# Patient Record
Sex: Female | Born: 1990 | Race: Black or African American | Hispanic: No | Marital: Single | Smoking: Current every day smoker
Health system: Southern US, Community
[De-identification: ages and names within clinical notes are randomized; demographics above are authoritative.]

## PROBLEM LIST (undated history)

## (undated) DIAGNOSIS — K2970 Gastritis, unspecified, without bleeding: Secondary | ICD-10-CM

## (undated) DIAGNOSIS — J45909 Unspecified asthma, uncomplicated: Secondary | ICD-10-CM

---

## 2018-08-06 ENCOUNTER — Encounter (HOSPITAL_BASED_OUTPATIENT_CLINIC_OR_DEPARTMENT_OTHER): Payer: Self-pay | Admitting: Emergency Medicine

## 2018-08-06 ENCOUNTER — Other Ambulatory Visit: Payer: Self-pay

## 2018-08-06 ENCOUNTER — Emergency Department (HOSPITAL_BASED_OUTPATIENT_CLINIC_OR_DEPARTMENT_OTHER)
Admission: EM | Admit: 2018-08-06 | Discharge: 2018-08-06 | Disposition: A | Discharge status: Home / Self Care | Payer: Self-pay | Attending: Emergency Medicine | Admitting: Emergency Medicine

## 2018-08-06 DIAGNOSIS — O9989 Other specified diseases and conditions complicating pregnancy, childbirth and the puerperium: Secondary | ICD-10-CM | POA: Insufficient documentation

## 2018-08-06 DIAGNOSIS — R1084 Generalized abdominal pain: Secondary | ICD-10-CM | POA: Insufficient documentation

## 2018-08-06 LAB — URINALYSIS, ROUTINE W REFLEX MICROSCOPIC
Bilirubin Urine: NEGATIVE
Glucose, UA: NEGATIVE mg/dL
Ketones, ur: NEGATIVE mg/dL
Nitrite: NEGATIVE
Protein, ur: NEGATIVE mg/dL
Specific Gravity, Urine: 1.02 (ref 1.005–1.030)
pH: 6 (ref 5.0–8.0)

## 2018-08-06 LAB — URINALYSIS, MICROSCOPIC (REFLEX)

## 2018-08-06 LAB — PREGNANCY, URINE: Preg Test, Ur: NEGATIVE

## 2018-08-06 NOTE — Discharge Instructions (Signed)
Your pregnancy test and ultrasound did not show an ongoing pregnancy at this time. I am very sorry. I have added the names and phone numbers of a local PCP and Gyn for follow up. You will need to call and schedule the next available appointment. Return to the ED with any new or suddenly worsening symptoms.

## 2018-08-06 NOTE — ED Notes (Signed)
Patient left at this time with all belongings. 

## 2018-08-06 NOTE — ED Triage Notes (Signed)
Pt c/o abd crampin 8/10 and lower back pain states she is 4 months pregnant, denies any vaginal discharge or bleeding no urinary symptoms.

## 2018-08-06 NOTE — ED Provider Notes (Signed)
Emergency Department Provider Note   I have reviewed the triage vital signs and the nursing notes.   HISTORY  Chief Complaint Abdominal Pain and Back Pain   HPI Felicia Jennings is a 28 y.o. female presents to the emergency department for evaluation of not feeling her pregnancy symptoms over the past several weeks.  She describes some left flank discomfort several days ago which has completely resolved.  4 weeks ago she had some vaginal spotting and abdominal cramping but no passage of clots or tissue.  She is recently moved to [place] and had an initial OB appointment early on in her pregnancy.  She estimates by that dating that she is approximately 4 months pregnant.  No fevers or chills.  No UTI symptoms.  No vaginal bleeding or discharge.   History reviewed. No pertinent past medical history.  There are no active problems to display for this patient.   History reviewed. No pertinent surgical history.  Allergies Patient has no known allergies.  History reviewed. No pertinent family history.  Social History Social History   Tobacco Use  . Smoking status: Never Smoker  . Smokeless tobacco: Never Used  Substance Use Topics  . Alcohol use: Never    Frequency: Never  . Drug use: Never    Review of Systems  Constitutional: No fever/chills Eyes: No visual changes. ENT: No sore throat. Cardiovascular: Denies chest pain. Respiratory: Denies shortness of breath. Gastrointestinal: Positive cramping abdominal pain (resolved).  No nausea, no vomiting.  No diarrhea.  No constipation. Genitourinary: Negative for dysuria. Musculoskeletal: Negative for back pain. Skin: Negative for rash. Neurological: Negative for headaches, focal weakness or numbness.  10-point ROS otherwise negative.  ____________________________________________   PHYSICAL EXAM:  VITAL SIGNS: ED Triage Vitals  Enc Vitals Group     BP 08/06/18 1959 (!) 138/99     Pulse Rate 08/06/18  1959 96     Resp 08/06/18 1959 18     Temp 08/06/18 1959 98.3 F (36.8 C)     Temp Source 08/06/18 1959 Oral     SpO2 08/06/18 1959 100 %     Weight 08/06/18 2000 244 lb (110.7 kg)     Height 08/06/18 2000 5\' 5"  (1.651 m)   Constitutional: Alert and oriented. Well appearing and in no acute distress. Eyes: Conjunctivae are normal.  Head: Atraumatic. Nose: No congestion/rhinnorhea. Mouth/Throat: Mucous membranes are moist. Neck: No stridor.  Cardiovascular: Normal rate, regular rhythm. Good peripheral circulation. Grossly normal heart sounds.   Respiratory: Normal respiratory effort.  No retractions. Lungs CTAB. Gastrointestinal: Soft and nontender. No distention.  Musculoskeletal: No lower extremity tenderness nor edema.  Neurologic:  Normal speech and language.  Skin:  Skin is warm, dry and intact. No rash noted.  ____________________________________________   LABS (all labs ordered are listed, but only abnormal results are displayed)  Labs Reviewed  URINALYSIS, ROUTINE W REFLEX MICROSCOPIC - Abnormal; Notable for the following components:      Result Value   Hgb urine dipstick LARGE (*)    Leukocytes,Ua TRACE (*)    All other components within normal limits  URINALYSIS, MICROSCOPIC (REFLEX) - Abnormal; Notable for the following components:   Bacteria, UA RARE (*)    All other components within normal limits  PREGNANCY, URINE   ____________________________________________   PROCEDURES  Procedure(s) performed:   Procedures  None  ____________________________________________   INITIAL IMPRESSION / ASSESSMENT AND PLAN / ED COURSE  Pertinent labs & imaging results that were available during my  care of the patient were reviewed by me and considered in my medical decision making (see chart for details).  Patient presents with not feeling her pregnancy.  She had an initial, early OB appointment several months ago but has not followed up since.  Her abdominal and flank  pain has resolved and was several weeks ago with some cramping in the past few days but none currently.  She is well-appearing on exam, afebrile.  I performed a quick bedside ultrasound which showed no visible pregnancy.  No abdominal fluid. Pregnancy test resulting negative.   08:30 PM  UA negative for infection.  Without active pain symptoms for the past days to weeks and negative pregnancy test do not feel that additional imaging is warranted.  I discussed my findings with the patient and advised that she follow-up with primary care physician.  Have also given information regarding local GYN.  Discussed ED return precautions in detail. ____________________________________________  FINAL CLINICAL IMPRESSION(S) / ED DIAGNOSES  Final diagnoses:  Generalized abdominal pain    Note:  This document was prepared using Dragon voice recognition software and may include unintentional dictation errors.  Alona Bene, MD Emergency Medicine     Lindsay Straka, Arlyss Repress, MD 08/06/18 2037

## 2018-08-06 NOTE — ED Notes (Signed)
ED Provider at bedside. 

## 2018-12-11 ENCOUNTER — Other Ambulatory Visit: Payer: Self-pay

## 2018-12-11 ENCOUNTER — Emergency Department (HOSPITAL_COMMUNITY): Payer: Self-pay

## 2018-12-11 ENCOUNTER — Encounter (HOSPITAL_COMMUNITY): Payer: Self-pay | Admitting: Obstetrics and Gynecology

## 2018-12-11 ENCOUNTER — Emergency Department (HOSPITAL_COMMUNITY)
Admission: EM | Admit: 2018-12-11 | Discharge: 2018-12-11 | Disposition: A | Discharge status: Home / Self Care | Payer: Self-pay | Attending: Emergency Medicine | Admitting: Emergency Medicine

## 2018-12-11 DIAGNOSIS — Y999 Unspecified external cause status: Secondary | ICD-10-CM | POA: Insufficient documentation

## 2018-12-11 DIAGNOSIS — Y939 Activity, unspecified: Secondary | ICD-10-CM | POA: Insufficient documentation

## 2018-12-11 DIAGNOSIS — W208XXA Other cause of strike by thrown, projected or falling object, initial encounter: Secondary | ICD-10-CM | POA: Insufficient documentation

## 2018-12-11 DIAGNOSIS — Y929 Unspecified place or not applicable: Secondary | ICD-10-CM | POA: Insufficient documentation

## 2018-12-11 DIAGNOSIS — S93401A Sprain of unspecified ligament of right ankle, initial encounter: Secondary | ICD-10-CM | POA: Insufficient documentation

## 2018-12-11 DIAGNOSIS — F17200 Nicotine dependence, unspecified, uncomplicated: Secondary | ICD-10-CM | POA: Insufficient documentation

## 2018-12-11 DIAGNOSIS — S8991XA Unspecified injury of right lower leg, initial encounter: Secondary | ICD-10-CM | POA: Insufficient documentation

## 2018-12-11 MED ORDER — IBUPROFEN 200 MG PO TABS
600.0000 mg | ORAL_TABLET | Freq: Once | ORAL | Status: AC
  Administered 2018-12-11: 600 mg via ORAL
  Filled 2018-12-11: qty 3

## 2018-12-11 NOTE — ED Provider Notes (Signed)
[facility] DEPT Provider Note   CSN: [id number] Arrival date & time: 12/11/18  1406    History   Chief Complaint Chief Complaint  Patient presents with  . Knee Pain  . Ankle Pain    HPI Felicia Jennings is a 28 y.o. female.     HPI  28 year old female presents with right ankle and right knee pain after an injury.  A mirror fell on her knee and then onto her ankle.  She is able to ambulate but has to limp.  The pain is severe.  This occurred just prior to arrival.  She took Tylenol for pain.  No weakness or numbness.  No past medical history on file.  There are no active problems to display for this patient.   No past surgical history on file.   OB History    Gravida  1   Para      Term      Preterm      AB      Living        SAB      TAB      Ectopic      Multiple      Live Births               Home Medications    Prior to Admission medications   Not on File    Family History No family history on file.  Social History Social History   Tobacco Use  . Smoking status: Current Every Day Smoker  . Smokeless tobacco: Never Used  Substance Use Topics  . Alcohol use: Yes    Alcohol/week: 7.0 standard drinks    Types: 7 Standard drinks or equivalent per week    Frequency: Never  . Drug use: Yes    Types: Marijuana     Allergies   Patient has no known allergies.   Review of Systems Review of Systems  Musculoskeletal: Positive for arthralgias and joint swelling.  Skin: Negative for wound.  Neurological: Negative for weakness and numbness.     Physical Exam Updated Vital Signs BP (!) 134/91 (BP Location: Left Arm)   Pulse (!) 103   Temp 98.8 F (37.1 C)   Resp 17   LMP 12/01/2018 (Approximate)   SpO2 99%   Breastfeeding Unknown   Physical Exam Vitals signs and nursing note reviewed.  Constitutional:      Appearance: She is well-developed.  HENT:     Head: Normocephalic and  atraumatic.     Right Ear: External ear normal.     Left Ear: External ear normal.     Nose: Nose normal.  Eyes:     General:        Right eye: No discharge.        Left eye: No discharge.  Cardiovascular:     Rate and Rhythm: Normal rate and regular rhythm.     Pulses:          Dorsalis pedis pulses are 2+ on the right side.     Heart sounds: Normal heart sounds.  Pulmonary:     Effort: Pulmonary effort is normal.     Breath sounds: Normal breath sounds.  Abdominal:     Palpations: Abdomen is soft.     Tenderness: There is no abdominal tenderness.  Musculoskeletal:     Right knee: She exhibits normal range of motion, no swelling and no effusion. Tenderness found. Lateral joint line tenderness noted.  Right ankle: She exhibits swelling. She exhibits normal range of motion. Tenderness. Lateral malleolus tenderness found.     Right lower leg: She exhibits tenderness (proximal and lateral). She exhibits no swelling.  Skin:    General: Skin is warm and dry.  Neurological:     Mental Status: She is alert.  Psychiatric:        Mood and Affect: Mood is not anxious.      ED Treatments / Results  Labs (all labs ordered are listed, but only abnormal results are displayed) Labs Reviewed - No data to display  EKG None  Radiology Dg Tibia/fibula Right  Result Date: 12/11/2018 CLINICAL DATA:  Right leg hit by a mirror. EXAM: RIGHT KNEE - COMPLETE 4+ VIEW; RIGHT TIBIA AND FIBULA - 2 VIEW COMPARISON:  None. FINDINGS: No evidence of fracture, dislocation, or joint effusion. No evidence of arthropathy or other focal bone abnormality. Soft tissues are unremarkable. No radiopaque foreign body identified. IMPRESSION: Negative. Electronically Signed   By: Obie DredgeWilliam T Derry M.D.   On: 12/11/2018 15:25   Dg Knee Complete 4 Views Right  Result Date: 12/11/2018 CLINICAL DATA:  Right leg hit by a mirror. EXAM: RIGHT KNEE - COMPLETE 4+ VIEW; RIGHT TIBIA AND FIBULA - 2 VIEW COMPARISON:  None.  FINDINGS: No evidence of fracture, dislocation, or joint effusion. No evidence of arthropathy or other focal bone abnormality. Soft tissues are unremarkable. No radiopaque foreign body identified. IMPRESSION: Negative. Electronically Signed   By: Obie DredgeWilliam T Derry M.D.   On: 12/11/2018 15:25    Procedures Procedures (including critical care time)  Medications Ordered in ED Medications  ibuprofen (ADVIL) tablet 600 mg (has no administration in time range)     Initial Impression / Assessment and Plan / ED Course  I have reviewed the triage vital signs and the nursing notes.  Pertinent labs & imaging results that were available during my care of the patient were reviewed by me and considered in my medical decision making (see chart for details).        Normal strength and sensation in her right foot.  Right ankle has some mild lateral swelling but full range of motion.  No tenderness over Achilles.  Right knee has some tenderness laterally but no swelling or effusion.  This appears to be superficial injuries.  Doubt occult fracture.  There were no dedicated ankle views when ordered in triage but there is no obvious lateral fracture on x-ray and my suspicion for fracture is pretty low.  Will Ace wrap and discussed using NSAIDs and Tylenol in alternating fashion.  Final Clinical Impressions(s) / ED Diagnoses   Final diagnoses:  Sprain of right ankle, unspecified ligament, initial encounter  Injury of right knee, initial encounter    ED Discharge Orders    None       Pricilla LovelessGoldston, Azilee Pirro, MD 12/11/18 1557

## 2018-12-11 NOTE — ED Notes (Signed)
When this nurse came in to wrap pts ankle, pt informed nurse that after the EDP came in to see her and examined her ankle that she has a new pain, burning/shooting like, going from her right knee down to her right ankle. EDP notified of new pain verbally.

## 2018-12-11 NOTE — ED Triage Notes (Signed)
Pt reports a mirror fell and hit her right knee and ankle. Pt reports throbbing and pain to the area.

## 2019-01-11 ENCOUNTER — Encounter (HOSPITAL_COMMUNITY): Payer: Self-pay

## 2019-01-11 ENCOUNTER — Emergency Department (HOSPITAL_COMMUNITY)
Admission: EM | Admit: 2019-01-11 | Discharge: 2019-01-11 | Disposition: A | Discharge status: Home / Self Care | Payer: Self-pay | Attending: Emergency Medicine | Admitting: Emergency Medicine

## 2019-01-11 ENCOUNTER — Other Ambulatory Visit: Payer: Self-pay

## 2019-01-11 DIAGNOSIS — R1013 Epigastric pain: Secondary | ICD-10-CM

## 2019-01-11 DIAGNOSIS — J45909 Unspecified asthma, uncomplicated: Secondary | ICD-10-CM | POA: Insufficient documentation

## 2019-01-11 DIAGNOSIS — K219 Gastro-esophageal reflux disease without esophagitis: Secondary | ICD-10-CM | POA: Insufficient documentation

## 2019-01-11 DIAGNOSIS — F17200 Nicotine dependence, unspecified, uncomplicated: Secondary | ICD-10-CM | POA: Insufficient documentation

## 2019-01-11 HISTORY — DX: Unspecified asthma, uncomplicated: J45.909

## 2019-01-11 HISTORY — DX: Gastritis, unspecified, without bleeding: K29.70

## 2019-01-11 LAB — COMPREHENSIVE METABOLIC PANEL
ALT: 24 U/L (ref 0–44)
AST: 20 U/L (ref 15–41)
Albumin: 4.1 g/dL (ref 3.5–5.0)
Alkaline Phosphatase: 30 U/L — ABNORMAL LOW (ref 38–126)
Anion gap: 11 (ref 5–15)
BUN: 12 mg/dL (ref 6–20)
CO2: 24 mmol/L (ref 22–32)
Calcium: 9.6 mg/dL (ref 8.9–10.3)
Chloride: 104 mmol/L (ref 98–111)
Creatinine, Ser: 0.71 mg/dL (ref 0.44–1.00)
GFR calc Af Amer: >60 mL/min (ref >60)
GFR calc non Af Amer: >60 mL/min (ref >60)
Glucose, Bld: 108 mg/dL — ABNORMAL HIGH (ref 70–99)
Potassium: 3.6 mmol/L (ref 3.5–5.1)
Sodium: 139 mmol/L (ref 135–145)
Total Bilirubin: 0.4 mg/dL (ref 0.3–1.2)
Total Protein: 8.1 g/dL (ref 6.5–8.1)

## 2019-01-11 LAB — URINALYSIS, ROUTINE W REFLEX MICROSCOPIC
Bilirubin Urine: NEGATIVE
Glucose, UA: NEGATIVE mg/dL
Hgb urine dipstick: NEGATIVE
Ketones, ur: NEGATIVE mg/dL
Leukocytes,Ua: NEGATIVE
Nitrite: NEGATIVE
Protein, ur: NEGATIVE mg/dL
Specific Gravity, Urine: 1.015 (ref 1.005–1.030)
pH: 7 (ref 5.0–8.0)

## 2019-01-11 LAB — CBC
HCT: 44.2 % (ref 36.0–46.0)
Hemoglobin: 14.6 g/dL (ref 12.0–15.0)
MCH: 29.7 pg (ref 26.0–34.0)
MCHC: 33 g/dL (ref 30.0–36.0)
MCV: 90 fL (ref 80.0–100.0)
Platelets: 311 10*3/uL (ref 150–400)
RBC: 4.91 MIL/uL (ref 3.87–5.11)
RDW: 12.6 % (ref 11.5–15.5)
WBC: 16.4 10*3/uL — ABNORMAL HIGH (ref 4.0–10.5)
nRBC: 0 % (ref 0.0–0.2)

## 2019-01-11 LAB — I-STAT BETA HCG BLOOD, ED (MC, WL, AP ONLY): I-stat hCG, quantitative: <5 m[IU]/mL (ref <5)

## 2019-01-11 LAB — LIPASE, BLOOD: Lipase: 23 U/L (ref 11–51)

## 2019-01-11 MED ORDER — SODIUM CHLORIDE 0.9% FLUSH: 3.0000 mL | Freq: Once | INTRAVENOUS | Status: DC

## 2019-01-11 MED ORDER — ALUM & MAG HYDROXIDE-SIMETH 200-200-20 MG/5ML PO SUSP
30.0000 mL | Freq: Once | ORAL | Status: AC
  Administered 2019-01-11: 30 mL via ORAL
  Filled 2019-01-11: qty 30

## 2019-01-11 MED ORDER — PANTOPRAZOLE SODIUM 20 MG PO TBEC: 20.0000 mg | DELAYED_RELEASE_TABLET | Freq: Every day | ORAL | 0 refills | Status: AC

## 2019-01-11 NOTE — ED Triage Notes (Signed)
States drank a Hospital doctor drink this am with abdominal pain and with nausea, states unable to have bowel movement and history of gastritis.

## 2019-01-11 NOTE — ED Notes (Signed)
Pt ambulatory from triage to room 

## 2019-01-11 NOTE — ED Provider Notes (Signed)
[facility]-EMERGENCY DEPT Provider Note   CSN: [id number] Arrival date & time: 01/11/19  1421     History   Chief Complaint Chief Complaint  Patient presents with  . Abdominal Pain    HPI Felicia Jennings is a 28 y.o. female.  Presents emergency department abdominal pain.  Patient states symptoms started this morning after she rapidly consumed entire monster drink.  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  States currently pain is 6 out of 10 in severity, worse in her upper abdomen, but radiates all over.  Has had some nausea, but no vomiting.  She states burning.  Has not taken any medication for this.  States that she has had gastritis previously.  Denies other medical problems, no past surgical history.  No constipation, diarrhea.     HPI  Past Medical History:  Diagnosis Date  . Asthma   . Gastritis     There are no active problems to display for this patient.   History reviewed. No pertinent surgical history.   OB History    Gravida  1   Para      Term      Preterm      AB      Living        SAB      TAB      Ectopic      Multiple      Live Births               Home Medications    Prior to Admission medications   Not on File    Family History History reviewed. No pertinent family history.  Social History Social History   Tobacco Use  . Smoking status: Current Every Day Smoker  . Smokeless tobacco: Never Used  Substance Use Topics  . Alcohol use: Yes    Alcohol/week: 7.0 standard drinks    Types: 7 Standard drinks or equivalent per week    Frequency: Never  . Drug use: Yes    Types: Marijuana     Allergies   Patient has no known allergies.   Review of Systems Review of Systems  Constitutional: Negative for chills and fever.  HENT: Negative for ear pain and  sore throat.   Eyes: Negative for pain and visual disturbance.  Respiratory: Negative for cough and shortness of breath.   Cardiovascular: Negative for chest pain and palpitations.  Gastrointestinal: Positive for abdominal pain and nausea. Negative for vomiting.  Genitourinary: Negative for dysuria and hematuria.  Musculoskeletal: Negative for arthralgias and back pain.  Skin: Negative for color change and rash.  Neurological: Negative for seizures and syncope.  All other systems reviewed and are negative.    Physical Exam Updated Vital Signs BP (!) 144/102 (BP Location: Left Arm)   Pulse 78   Temp 98.8 F (37.1 C) (Oral)   Resp 18   Ht 5\' 5"  (1.651 m)   Wt 108.9 kg   SpO2 100%   BMI 39.94 kg/m   Physical Exam Vitals signs and nursing note reviewed.  Constitutional:      General: She is not in acute distress.    Appearance: She is well-developed.  HENT:     Head: Normocephalic and atraumatic.  Eyes:     Conjunctiva/sclera: Conjunctivae normal.  Neck:     Musculoskeletal: Neck supple.  Cardiovascular:  Rate and Rhythm: Normal rate and regular rhythm.     Heart sounds: No murmur.  Pulmonary:     Effort: Pulmonary effort is normal. No respiratory distress.     Breath sounds: Normal breath sounds.  Abdominal:     Palpations: Abdomen is soft.     Tenderness: There is abdominal tenderness in the epigastric area and periumbilical area.  Skin:    General: Skin is warm and dry.  Neurological:     Mental Status: She is alert.      ED Treatments / Results  Labs (all labs ordered are listed, but only abnormal results are displayed) Labs Reviewed  COMPREHENSIVE METABOLIC PANEL - Abnormal; Notable for the following components:      Result Value   Glucose, Bld 108 (*)    Alkaline Phosphatase 30 (*)    All other components within normal limits  CBC - Abnormal; Notable for the following components:   WBC 16.4 (*)    All other components within normal limits   LIPASE, BLOOD  URINALYSIS, ROUTINE W REFLEX MICROSCOPIC  I-STAT BETA HCG BLOOD, ED ([facility], [facility], [facility] ONLY)    EKG None  Radiology No results found.  Procedures Procedures (including critical care time)  Medications Ordered in ED Medications  sodium chloride flush (NS) 0.9 % injection 3 mL (has no administration in time range)  alum & mag hydroxide-simeth (MAALOX/MYLANTA) 200-200-20 MG/5ML suspension 30 mL (has no administration in time range)     Initial Impression / Assessment and Plan / ED Course  I have reviewed the triage vital signs and the nursing notes.  Pertinent labs & imaging results that were available during my care of the patient were reviewed by me and considered in my medical decision making (see chart for details).        28 year old lady presented to the ER with chief complaint abdominal pain.  Symptoms associated with drinking monster.  On exam abdomen soft, noted mild tenderness palpation epigastric region.  Given description of burning sensation, suspect patient may have gastric reflux.  Noted upper quadrant tenderness, LFTs within normal limits, doubt acute biliary process. No complaints of right lower quadrant pain, no tenderness palpation right lower quadrant, doubt appendicitis.  Recommend trial of PPI.  Relatively short duration of symptoms, patient very well appearing with normal vital signs and a soft abdomen.  Do not feel emergent CT imaging warranted at this time.  As precaution though, I recommended patient return for recheck in 12-24 hours should she have any persistence of symptoms.    After the discussed management above, the patient was determined to be safe for discharge.  The patient was in agreement with this plan and all questions regarding their care were answered.  ED return precautions were discussed and the patient will return to the ED with any significant worsening of condition.    Final Clinical Impressions(s) / ED Diagnoses   Final  diagnoses:  Epigastric abdominal pain  Gastric reflux    ED Discharge Orders    None       Lucrezia Starch, MD 01/11/19 [name]

## 2019-01-11 NOTE — Discharge Instructions (Signed)
Please take the medication as prescribed for the next 30 days.  Please follow-up with your primary care doctor, if you do not have one you may call the number divided to have assistance setting 1 up.  Recommend coming back tomorrow within 24 hours for recheck in the emergency department if you continue to have ANY symptoms particularly any persistent abdominal pain.  If your pain is worsening, you develop fever, vomiting or other new symptoms please return to the ER for reassessment.

## 2020-01-07 IMAGING — CR RIGHT KNEE - COMPLETE 4+ VIEW
4 series · 4 of 4 positions shown · non-contrast
Comparison: None.

CLINICAL DATA: Right leg hit by a mirror.

EXAM:
RIGHT KNEE - COMPLETE 4+ VIEW; RIGHT TIBIA AND FIBULA - 2 VIEW

[t knee ap right]
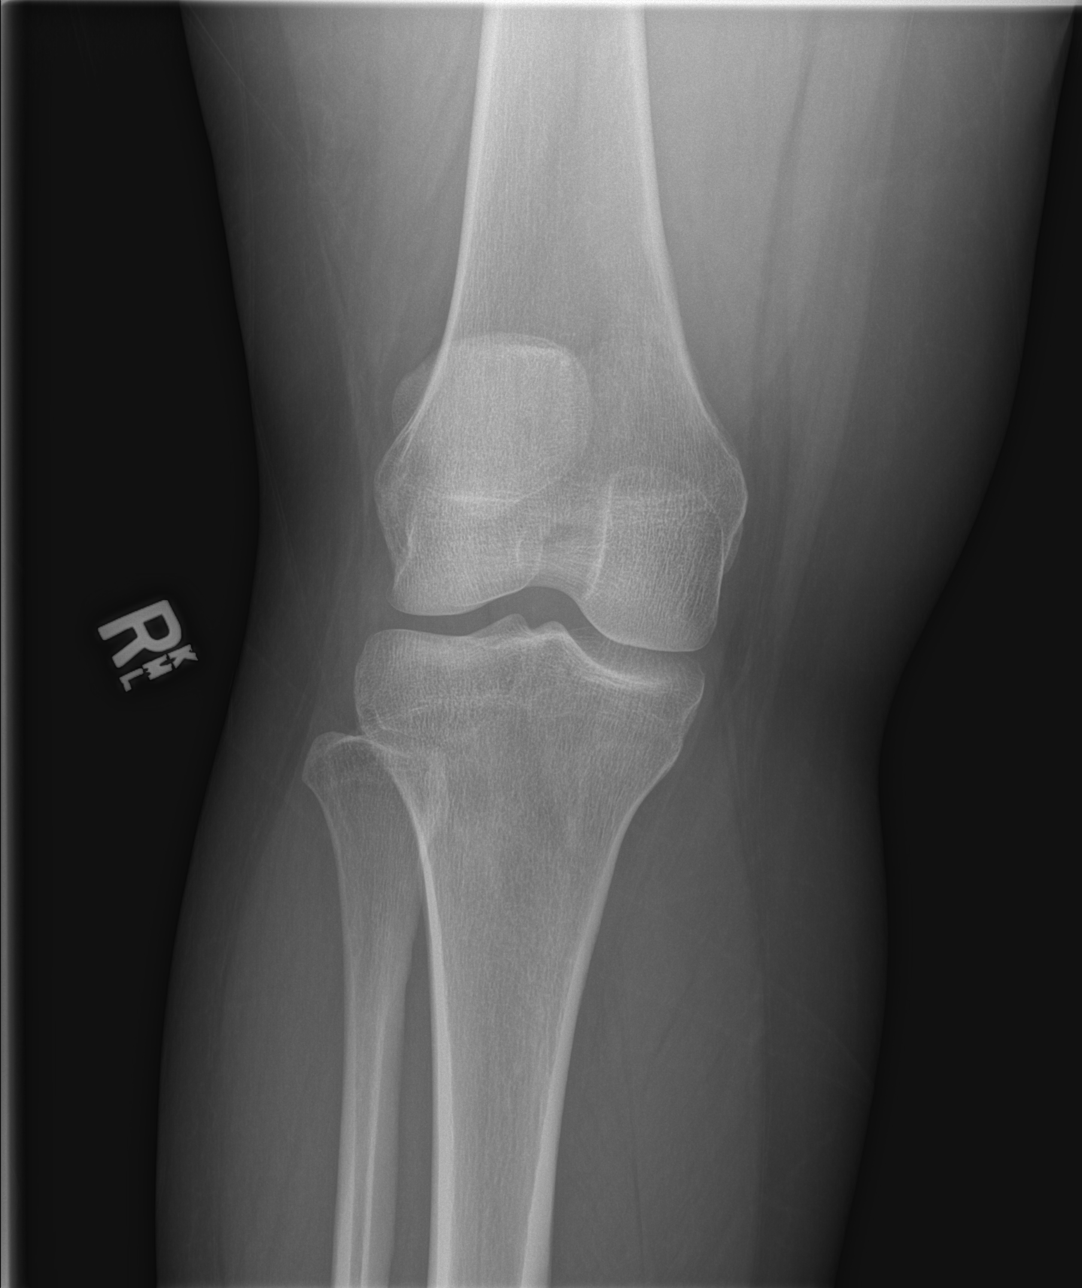

[t knee obl right (1 of 2)]
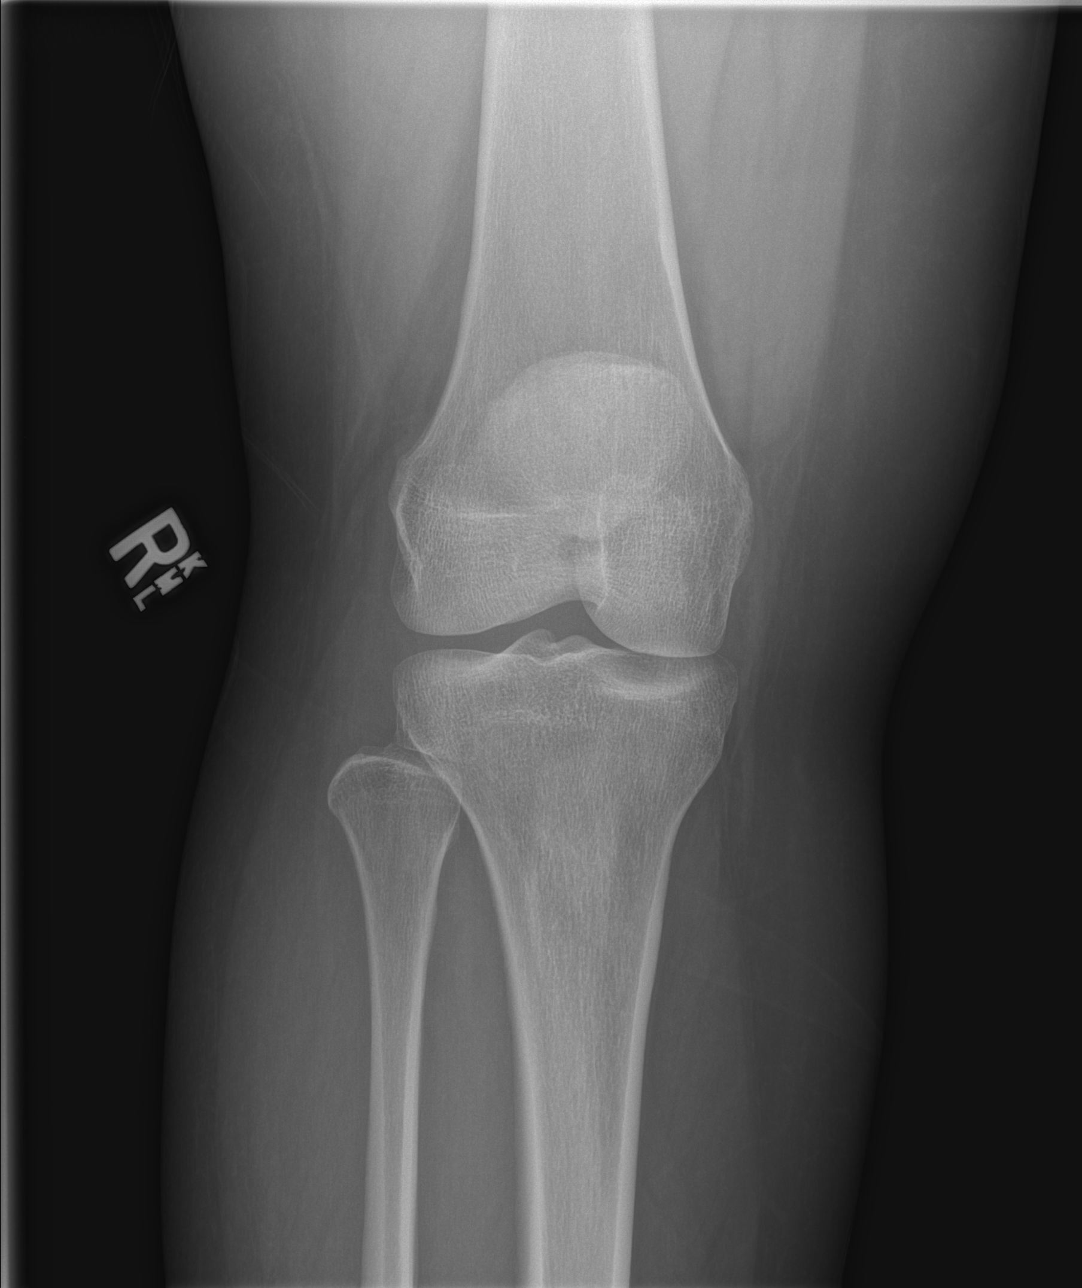

[t knee obl right (2 of 2)]
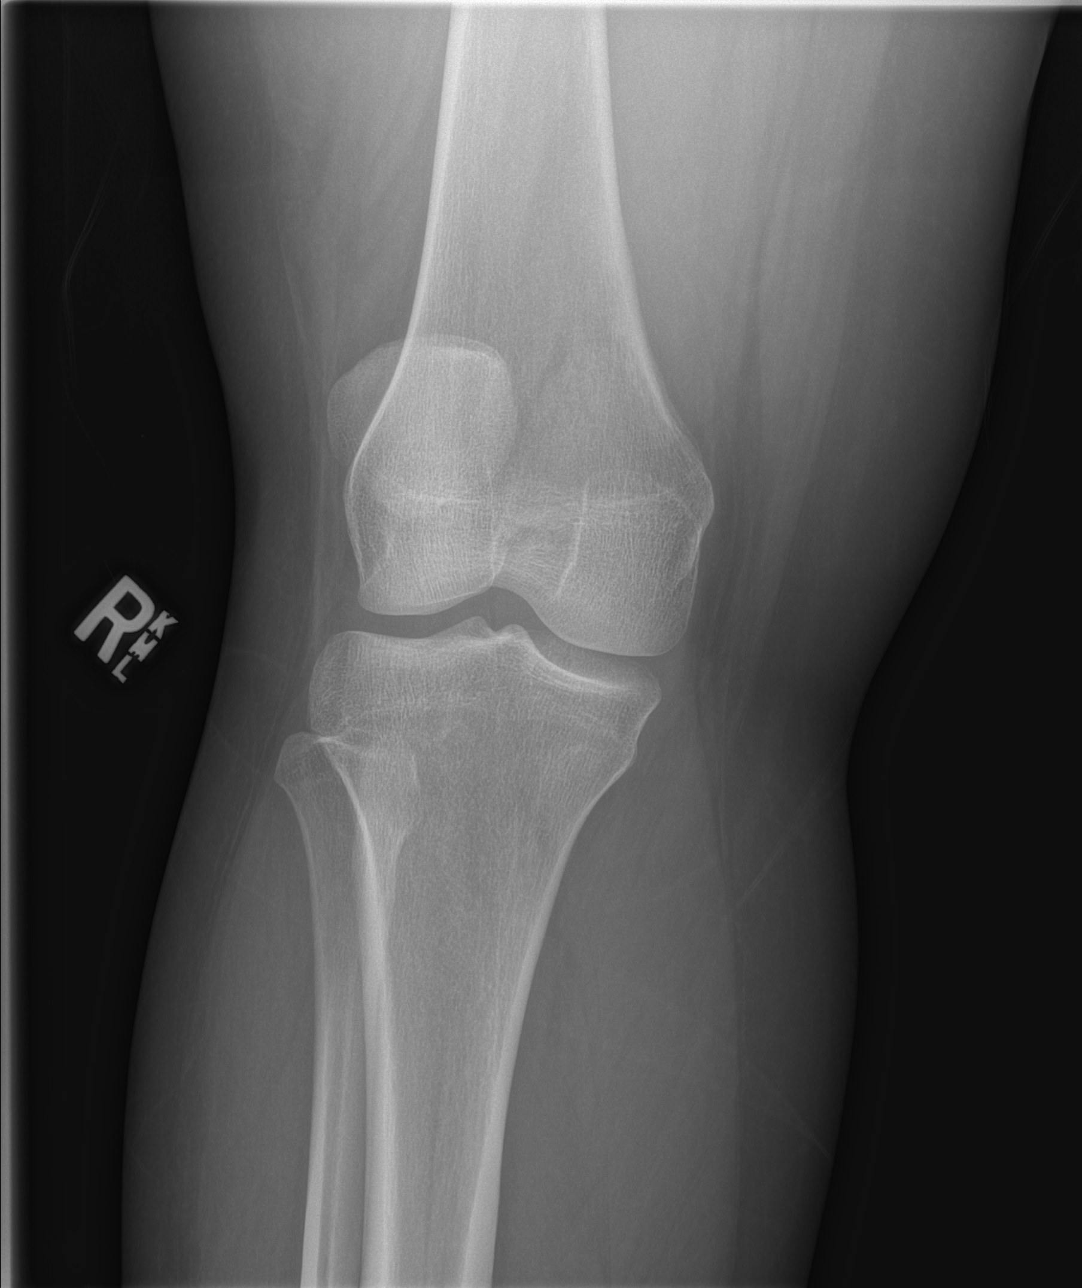

[t knee lat right]
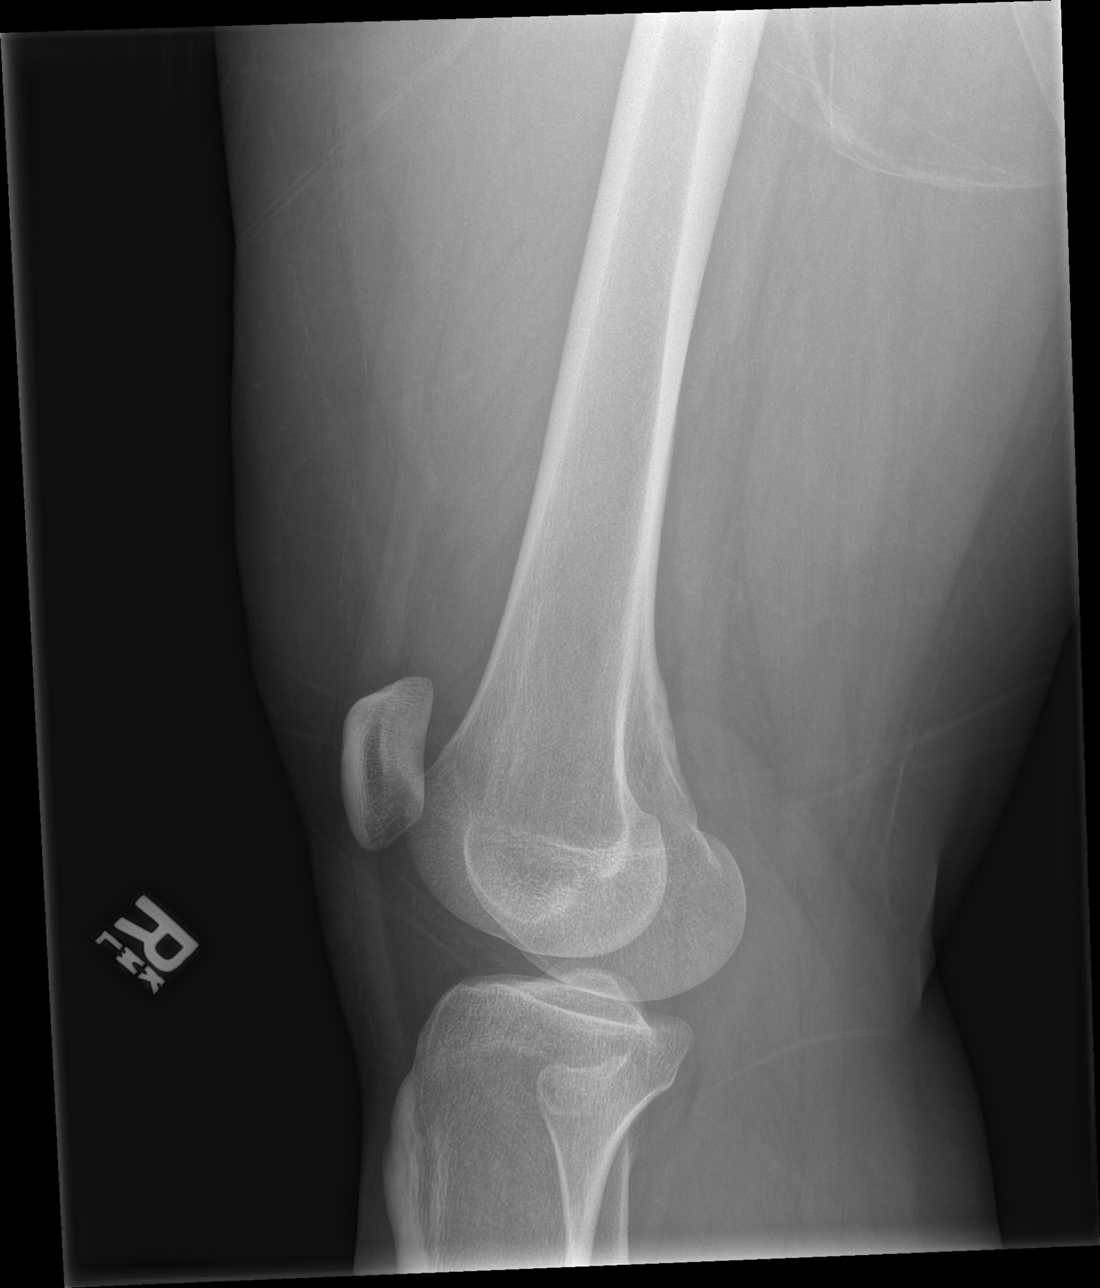

[4 of 4 positions shown; findings below may reference images not displayed]

FINDINGS: No evidence of fracture, dislocation, or joint effusion. No evidence
of arthropathy or other focal bone abnormality. Soft tissues are
unremarkable. No radiopaque foreign body identified.
IMPRESSION: Negative.
